# Patient Record
Sex: Female | Born: 1965 | Race: White | Hispanic: No | Marital: Married | Smoking: Never smoker
Health system: Southern US, Community
[De-identification: ages and names within clinical notes are randomized; demographics above are authoritative.]

## PROBLEM LIST (undated history)

## (undated) DIAGNOSIS — G43909 Migraine, unspecified, not intractable, without status migrainosus: Secondary | ICD-10-CM

## (undated) HISTORY — PX: ELBOW SURGERY: SHX618

## (undated) HISTORY — PX: ABDOMINAL HYSTERECTOMY: SHX81

---

## 2005-08-14 ENCOUNTER — Ambulatory Visit (HOSPITAL_COMMUNITY): Admission: RE | Admit: 2005-08-14 | Discharge: 2005-08-14 | Payer: Self-pay | Admitting: Obstetrics and Gynecology

## 2006-09-09 ENCOUNTER — Ambulatory Visit (HOSPITAL_COMMUNITY): Admission: RE | Admit: 2006-09-09 | Discharge: 2006-09-09 | Payer: Self-pay | Admitting: Obstetrics and Gynecology

## 2007-09-25 ENCOUNTER — Ambulatory Visit (HOSPITAL_COMMUNITY): Admission: RE | Admit: 2007-09-25 | Discharge: 2007-09-25 | Payer: Self-pay | Admitting: Family Medicine

## 2009-01-18 ENCOUNTER — Ambulatory Visit (HOSPITAL_COMMUNITY): Admission: RE | Admit: 2009-01-18 | Discharge: 2009-01-18 | Payer: Self-pay | Admitting: Internal Medicine

## 2009-05-30 ENCOUNTER — Encounter (INDEPENDENT_AMBULATORY_CARE_PROVIDER_SITE_OTHER): Payer: Self-pay | Admitting: Obstetrics and Gynecology

## 2009-05-30 ENCOUNTER — Ambulatory Visit (HOSPITAL_COMMUNITY): Admission: RE | Admit: 2009-05-30 | Discharge: 2009-05-31 | Payer: Self-pay | Admitting: Obstetrics and Gynecology

## 2010-04-10 ENCOUNTER — Ambulatory Visit (HOSPITAL_COMMUNITY): Admission: RE | Admit: 2010-04-10 | Payer: Self-pay | Source: Home / Self Care | Admitting: Family Medicine

## 2010-05-06 ENCOUNTER — Encounter: Payer: Self-pay | Admitting: Family Medicine

## 2010-05-14 ENCOUNTER — Other Ambulatory Visit (HOSPITAL_COMMUNITY): Payer: Self-pay | Admitting: Family Medicine

## 2010-05-14 DIAGNOSIS — Z1239 Encounter for other screening for malignant neoplasm of breast: Secondary | ICD-10-CM

## 2010-05-21 ENCOUNTER — Ambulatory Visit (HOSPITAL_COMMUNITY)
Admission: RE | Admit: 2010-05-21 | Discharge: 2010-05-21 | Disposition: A | Discharge status: Home / Self Care | Payer: Managed Care, Other (non HMO) | Source: Ambulatory Visit | Attending: Family Medicine | Admitting: Family Medicine

## 2010-05-21 DIAGNOSIS — Z1231 Encounter for screening mammogram for malignant neoplasm of breast: Secondary | ICD-10-CM | POA: Insufficient documentation

## 2010-05-21 DIAGNOSIS — Z1239 Encounter for other screening for malignant neoplasm of breast: Secondary | ICD-10-CM

## 2010-07-04 LAB — CBC
HCT: 28.8 % — ABNORMAL LOW (ref 36.0–46.0)
HCT: 39.2 % (ref 36.0–46.0)
MCHC: 33.8 g/dL (ref 30.0–36.0)
MCV: 91.1 fL (ref 78.0–100.0)
MCV: 92.3 fL (ref 78.0–100.0)
Platelets: 238 10*3/uL (ref 150–400)
Platelets: 276 10*3/uL (ref 150–400)
RBC: 4.25 MIL/uL (ref 3.87–5.11)
RDW: 12.6 % (ref 11.5–15.5)
WBC: 12.7 10*3/uL — ABNORMAL HIGH (ref 4.0–10.5)
WBC: 7.2 10*3/uL (ref 4.0–10.5)

## 2011-05-01 ENCOUNTER — Other Ambulatory Visit (HOSPITAL_COMMUNITY): Payer: Self-pay | Admitting: Family Medicine

## 2011-05-01 DIAGNOSIS — Z1231 Encounter for screening mammogram for malignant neoplasm of breast: Secondary | ICD-10-CM

## 2011-05-29 ENCOUNTER — Ambulatory Visit (HOSPITAL_COMMUNITY)
Admission: RE | Admit: 2011-05-29 | Discharge: 2011-05-29 | Disposition: A | Discharge status: Home / Self Care | Payer: BC Managed Care – PPO | Source: Ambulatory Visit | Attending: Family Medicine | Admitting: Family Medicine

## 2011-05-29 DIAGNOSIS — Z1231 Encounter for screening mammogram for malignant neoplasm of breast: Secondary | ICD-10-CM

## 2012-03-03 ENCOUNTER — Other Ambulatory Visit: Payer: Self-pay | Admitting: Physical Medicine and Rehabilitation

## 2012-03-03 DIAGNOSIS — N83201 Unspecified ovarian cyst, right side: Secondary | ICD-10-CM

## 2012-03-09 ENCOUNTER — Other Ambulatory Visit: Payer: Managed Care, Other (non HMO)

## 2012-03-17 ENCOUNTER — Encounter (HOSPITAL_COMMUNITY): Payer: Self-pay | Admitting: Emergency Medicine

## 2012-03-17 ENCOUNTER — Emergency Department (HOSPITAL_COMMUNITY)
Admission: EM | Admit: 2012-03-17 | Discharge: 2012-03-17 | Disposition: A | Discharge status: Home / Self Care | Payer: BC Managed Care – PPO | Attending: Emergency Medicine | Admitting: Emergency Medicine

## 2012-03-17 DIAGNOSIS — Y9301 Activity, walking, marching and hiking: Secondary | ICD-10-CM | POA: Insufficient documentation

## 2012-03-17 DIAGNOSIS — M79606 Pain in leg, unspecified: Secondary | ICD-10-CM

## 2012-03-17 DIAGNOSIS — Z79899 Other long term (current) drug therapy: Secondary | ICD-10-CM | POA: Insufficient documentation

## 2012-03-17 DIAGNOSIS — S86119A Strain of other muscle(s) and tendon(s) of posterior muscle group at lower leg level, unspecified leg, initial encounter: Secondary | ICD-10-CM

## 2012-03-17 DIAGNOSIS — S838X9A Sprain of other specified parts of unspecified knee, initial encounter: Secondary | ICD-10-CM | POA: Insufficient documentation

## 2012-03-17 DIAGNOSIS — Y929 Unspecified place or not applicable: Secondary | ICD-10-CM | POA: Insufficient documentation

## 2012-03-17 DIAGNOSIS — X500XXA Overexertion from strenuous movement or load, initial encounter: Secondary | ICD-10-CM | POA: Insufficient documentation

## 2012-03-17 MED ORDER — HYDROCODONE-ACETAMINOPHEN 5-325 MG PO TABS: 1.0000 | ORAL_TABLET | ORAL | Status: AC | PRN

## 2012-03-17 MED ORDER — HYDROCODONE-ACETAMINOPHEN 5-325 MG PO TABS
1.0000 | ORAL_TABLET | Freq: Once | ORAL | Status: AC
  Administered 2012-03-17: 1 via ORAL
  Filled 2012-03-17: qty 1

## 2012-03-17 NOTE — ED Notes (Signed)
Ortho at bedside.

## 2012-03-17 NOTE — Progress Notes (Signed)
Orthopedic Tech Progress Note Patient Details:  Shelley Daniel [birth year] [id number]  Ortho Devices Type of Ortho Device: Crutches;Post (short) splint Splint Material: Fiberglass Ortho Device/Splint Location: right leg Ortho Device/Splint Interventions: Application   [name] 03/17/2012, 8:47 PM

## 2012-03-17 NOTE — ED Provider Notes (Signed)
History     CSN: [id number]  Arrival date & time 03/17/12  1805   First MD Initiated Contact with Patient 03/17/12 1958      Chief Complaint  Patient presents with  . Leg Pain   HPI  History provided by the patient. Patient is a 46 year old female with no significant PMH who presents with complaints of acute right lower leg pain and injury. Patient states that she was walking down some steps when she suddenly felt a pop and sharp pain in her right calf and lower leg. Since that time she has had pain with any kind of walking or pressure to the foot. She also feels a tightness and swelling in the upper calf area it is tender to palpation. Patient has difficulty with any movements in the foot secondary to pain. Patient has not taken any medications for her symptoms. She denies having an prior injuries to her legs. Since injury patient reports having some coolness to the foot and slight numbness.    History reviewed. No pertinent past medical history.  History reviewed. No pertinent past surgical history.  History reviewed. No pertinent family history.  History  Substance Use Topics  . Smoking status: Never Smoker   . Smokeless tobacco: Not on file  . Alcohol Use: No    OB History    Grav Para Term Preterm Abortions TAB SAB Ect Mult Living                  Review of Systems  Musculoskeletal:       Calf pain.  Skin: Negative for rash.  Neurological: Positive for numbness. Negative for weakness.  All other systems reviewed and are negative.    Allergies  Penicillins  Home Medications   Current Outpatient Rx  Name  Route  Sig  Dispense  Refill  . FLUOXETINE HCL 20 MG PO CAPS   Oral   Take 20 mg by mouth daily.         Marland Kitchen KETOROLAC TROMETHAMINE 10 MG PO TABS   Oral   Take 10 mg by mouth 2 (two) times daily as needed. For migraine         . NARATRIPTAN HCL 2.5 MG PO TABS   Oral   Take 2.5 mg by mouth as needed. Take one (1) tablet at onset of headache; if  returns or does not resolve, may repeat after 4 hours; do not exceed five (5) mg in 24 hours.           BP 139/76  Pulse 72  Temp 98.2 F (36.8 C) (Oral)  Resp 18  SpO2 98%  Physical Exam  Nursing note and vitals reviewed. Constitutional: She is oriented to person, place, and time. She appears well-developed and well-nourished. No distress.  HENT:  Head: Normocephalic.  Cardiovascular: Normal rate and regular rhythm.   Pulmonary/Chest: Effort normal and breath sounds normal.  Musculoskeletal: She exhibits edema and tenderness.       Reduced range of motion of right ankle and foot secondary to pain. There is mild/moderate swelling to the posterior right lower leg around the proximal calf. There is tenderness over this area to palpation. Normal Thompson test. Palpation along the acuities tendon appears intact. Patient has normal dorsal pedal pulses. Patient reports having slight numbness in the toes and foot but can sense light touch. Normal cap refill less than 2 seconds.  Neurological: She is alert and oriented to person, place, and time.  Skin: Skin is warm and  dry. No rash noted. No erythema.  Psychiatric: She has a normal mood and affect. Her behavior is normal.    ED Course  Procedures     1. Gastrocnemius muscle tear   2. Leg pain       MDM  8:05PM patient seen and evaluated. Patient appears in mild discomfort.   Patient placed in short leg splint and provided crutches. Patient does not appear to have signs of complete Achilles tendon rupture. She may have partial tear or possibly just muscle tear. Orthopedic referral provided. Patient instructed on RICE treatment.     Angus Seller, [place] 03/17/12 2059

## 2012-03-17 NOTE — ED Notes (Signed)
Pt c/o pain in right calf after stepping off step and feeling pop; pt sts pain down into foot and she is unable to walk on and having numbness in foot

## 2012-03-20 NOTE — ED Provider Notes (Signed)
Medical screening examination/treatment/procedure(s) were performed by non-physician practitioner and as supervising physician I was immediately available for consultation/collaboration.   Joya Gaskins, MD 03/20/12 [id number]

## 2012-04-02 ENCOUNTER — Ambulatory Visit
Admission: RE | Admit: 2012-04-02 | Discharge: 2012-04-02 | Disposition: A | Discharge status: Home / Self Care | Payer: BC Managed Care – PPO | Source: Ambulatory Visit | Attending: Physical Medicine and Rehabilitation | Admitting: Physical Medicine and Rehabilitation

## 2012-04-02 DIAGNOSIS — N83201 Unspecified ovarian cyst, right side: Secondary | ICD-10-CM

## 2012-04-16 ENCOUNTER — Other Ambulatory Visit (HOSPITAL_COMMUNITY): Payer: Self-pay | Admitting: Specialist

## 2012-04-16 DIAGNOSIS — M79669 Pain in unspecified lower leg: Secondary | ICD-10-CM

## 2012-04-16 DIAGNOSIS — M7989 Other specified soft tissue disorders: Secondary | ICD-10-CM

## 2012-04-17 ENCOUNTER — Ambulatory Visit (HOSPITAL_COMMUNITY)
Admission: RE | Admit: 2012-04-17 | Discharge: 2012-04-17 | Disposition: A | Discharge status: Home / Self Care | Payer: BC Managed Care – PPO | Source: Ambulatory Visit | Attending: Specialist | Admitting: Specialist

## 2012-04-17 ENCOUNTER — Ambulatory Visit (HOSPITAL_COMMUNITY): Payer: BC Managed Care – PPO

## 2012-04-17 DIAGNOSIS — M7989 Other specified soft tissue disorders: Secondary | ICD-10-CM

## 2012-04-17 DIAGNOSIS — M79669 Pain in unspecified lower leg: Secondary | ICD-10-CM

## 2012-04-17 DIAGNOSIS — M79609 Pain in unspecified limb: Secondary | ICD-10-CM

## 2012-04-17 NOTE — Progress Notes (Signed)
Right:  No evidence of DVT, superficial thrombosis, or Baker's cyst.  Left:  Negative for DVT in the common femoral vein.  

## 2012-04-27 ENCOUNTER — Other Ambulatory Visit (HOSPITAL_COMMUNITY): Payer: Self-pay | Admitting: Family Medicine

## 2012-04-27 DIAGNOSIS — Z1231 Encounter for screening mammogram for malignant neoplasm of breast: Secondary | ICD-10-CM

## 2012-05-29 ENCOUNTER — Ambulatory Visit (HOSPITAL_COMMUNITY): Payer: BC Managed Care – PPO

## 2012-06-10 ENCOUNTER — Ambulatory Visit (HOSPITAL_COMMUNITY)
Admission: RE | Admit: 2012-06-10 | Discharge: 2012-06-10 | Disposition: A | Discharge status: Home / Self Care | Payer: BC Managed Care – PPO | Source: Ambulatory Visit | Attending: Family Medicine | Admitting: Family Medicine

## 2012-06-10 DIAGNOSIS — Z1231 Encounter for screening mammogram for malignant neoplasm of breast: Secondary | ICD-10-CM

## 2013-01-30 IMAGING — US US PELVIS COMPLETE
1 series · 14 of 25 positions shown · non-contrast
Comparison: None

CLINICAL DATA: Right ovarian cyst seen on MRI.  Post hysterectomy



[Series 1: us pelvis complete · 0.33mm/px · 14 of 47 slices shown]
[im 1/47]
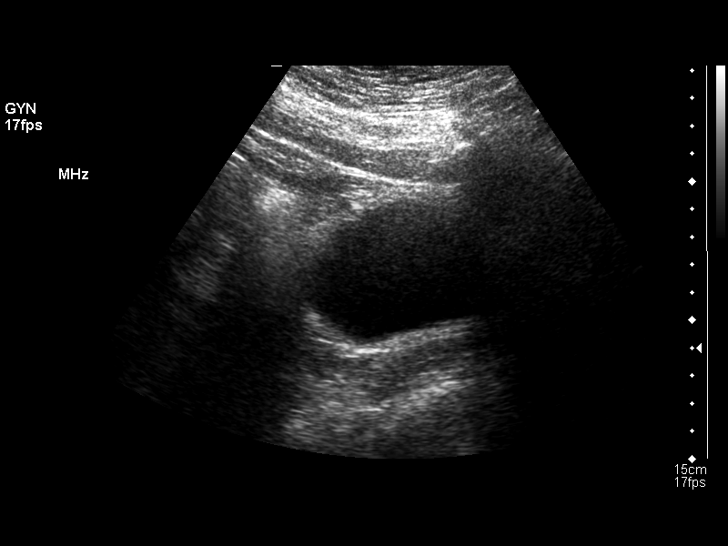
[im 4/47]
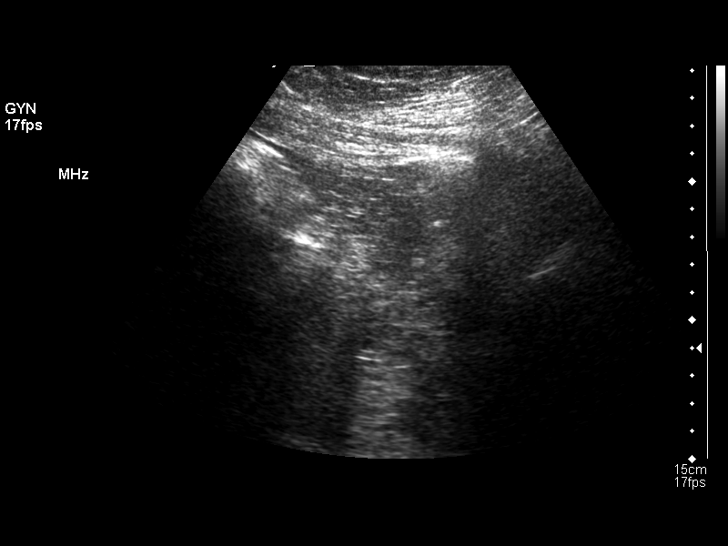
[im 8/47]
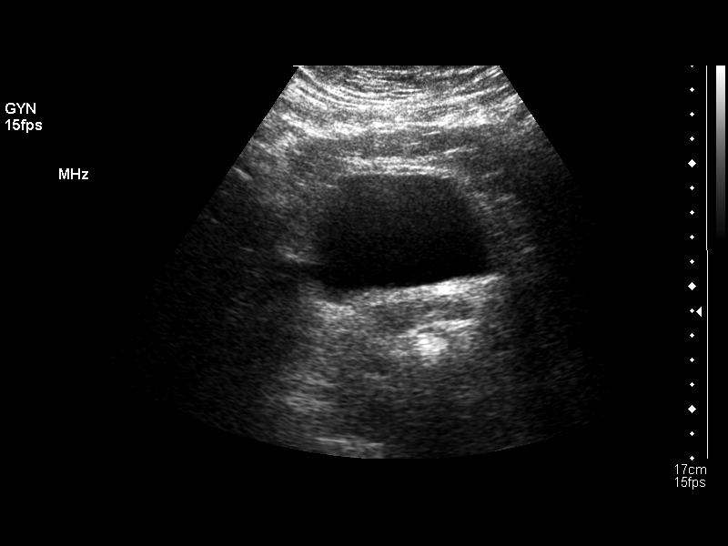
[im 12/47]
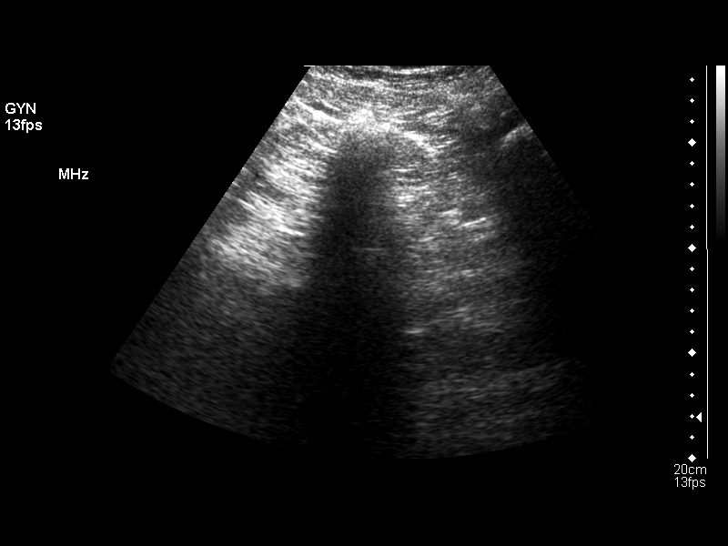
[im 16/47]
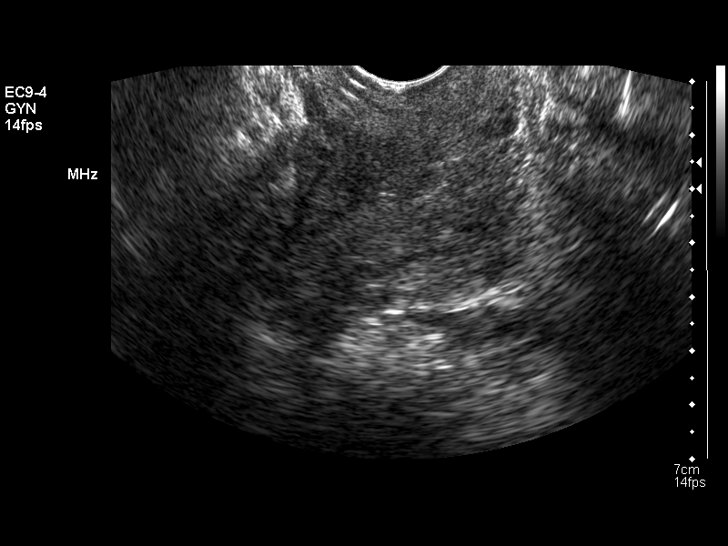
[im 18/47]
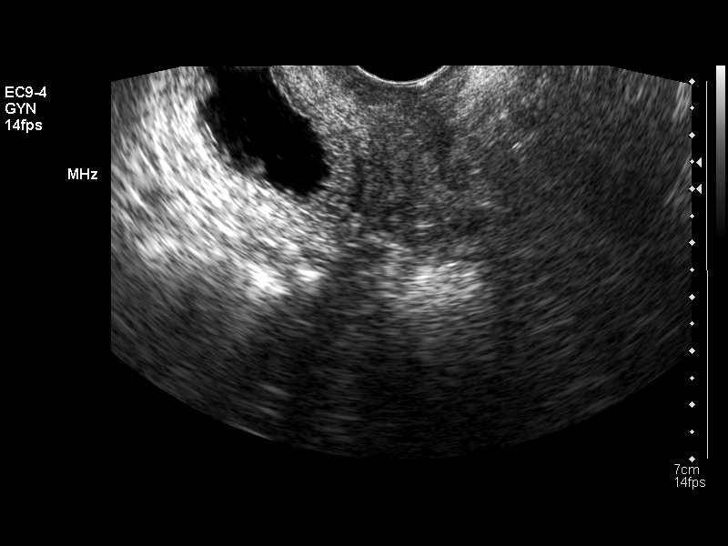
[im 22/47]
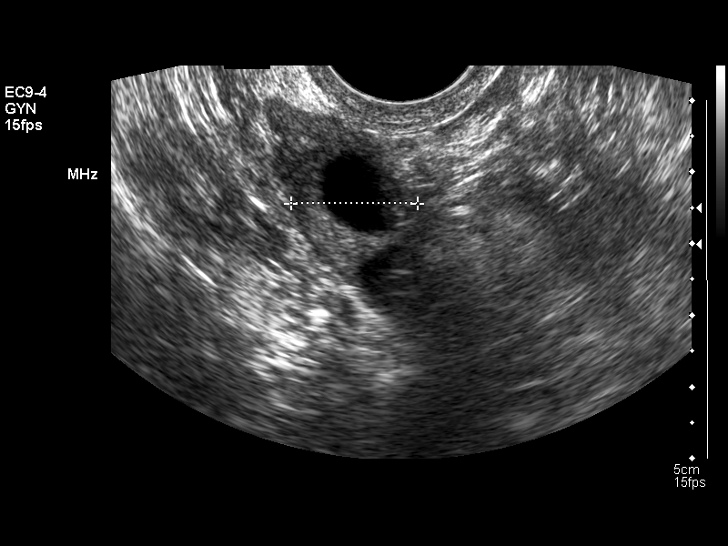
[im 25/47]
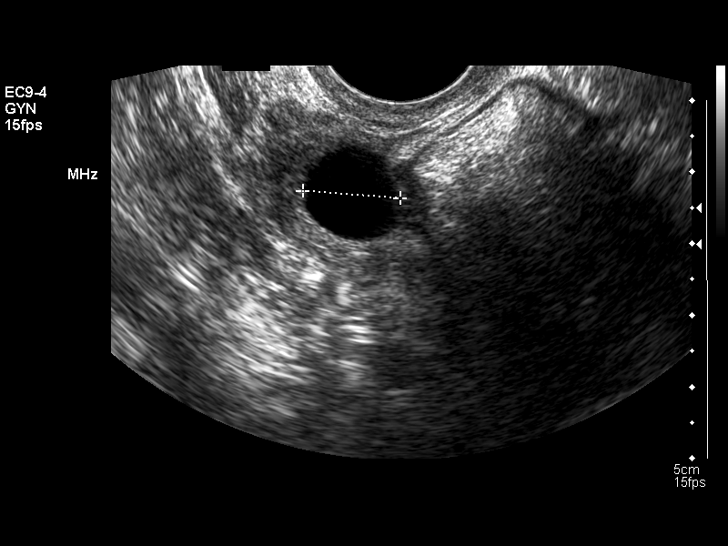
[im 29/47]
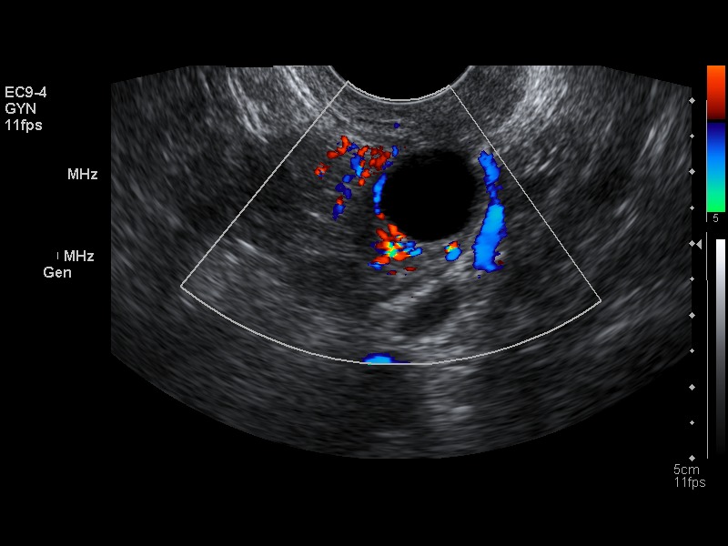
[im 31/47]
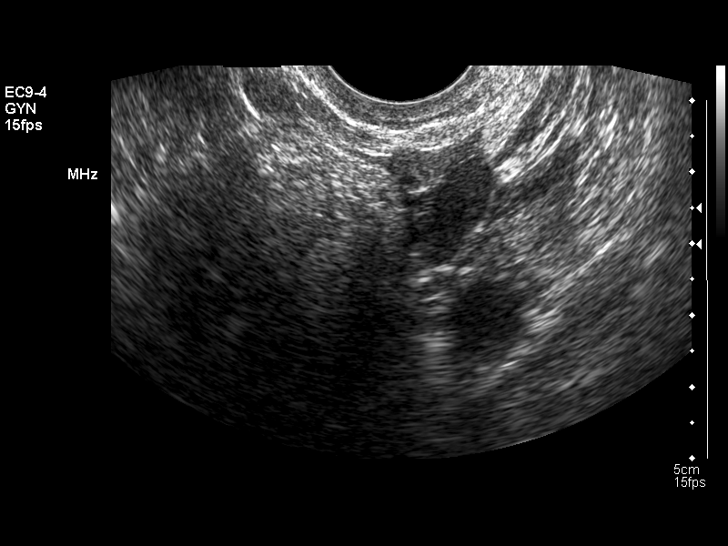
[im 35/47]
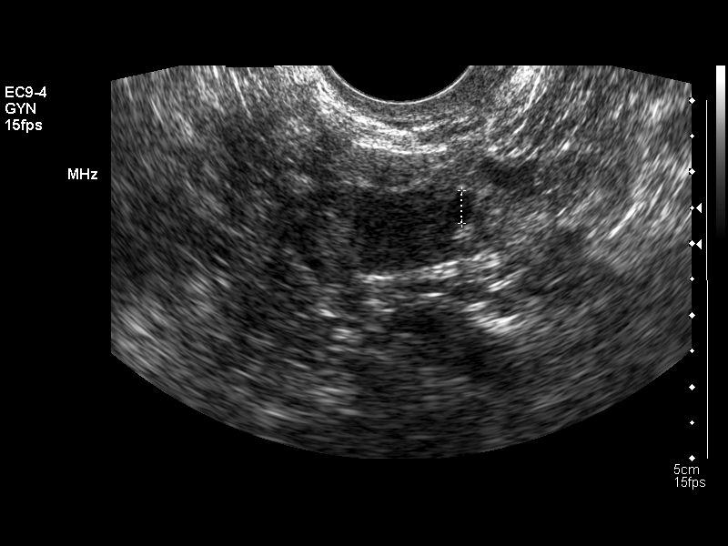
[im 39/47]
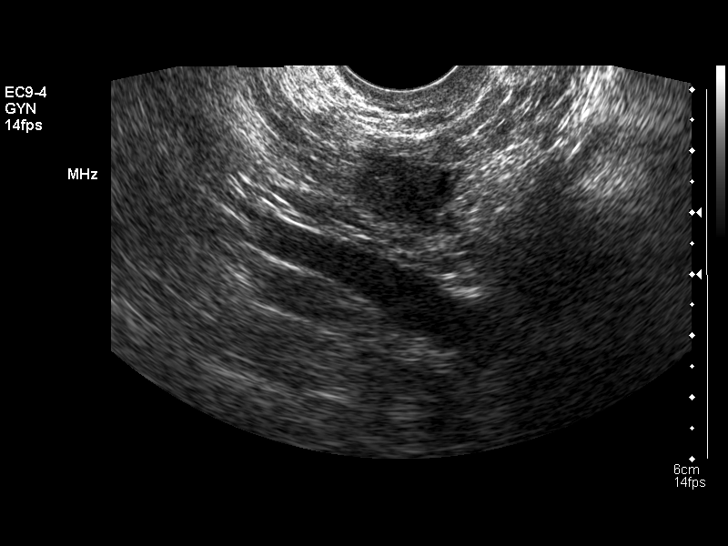
[im 43/47]
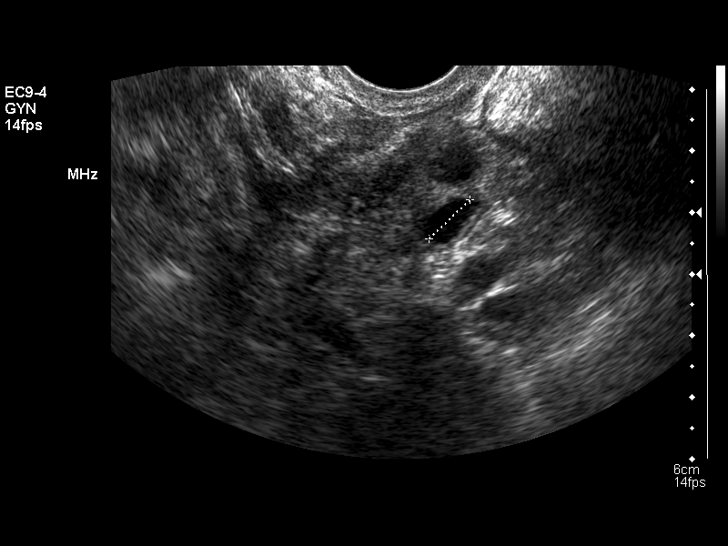
[im 47/47]
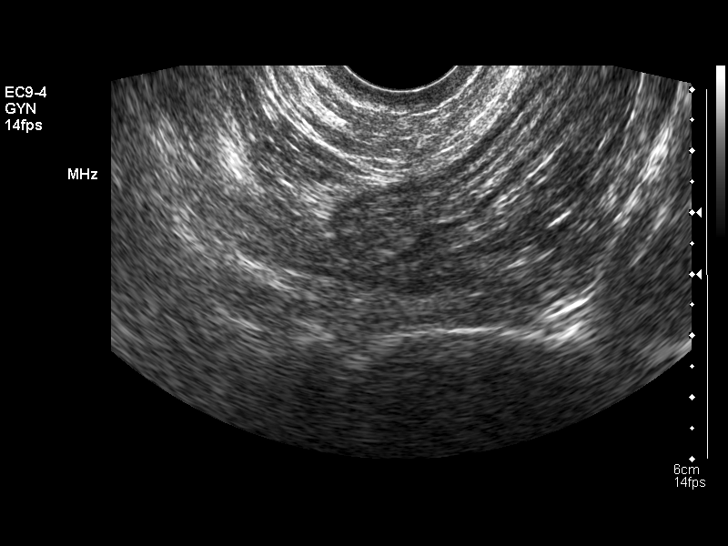

[14 of 25 positions shown; findings below may reference images not displayed]

FINDINGS: Uterus: Has been surgically removed.  A normal vaginal cuff is
identified

Endometrium: Not applicable

Right ovary:  Has a normal appearance measuring 2.5 x 1.6 x 1.8 cm
and contains a 1.4 cm follicle

Left ovary: Has a normal appearance measuring 2.2 x 1.1 x 0.9 cm
and contains several sub centimeter follicles

Other findings: No pelvic fluid or separate adnexal masses are
identified.
IMPRESSION: Normal vaginal cuff and premenopausal appearance to the ovaries..

## 2013-03-05 ENCOUNTER — Encounter (HOSPITAL_BASED_OUTPATIENT_CLINIC_OR_DEPARTMENT_OTHER): Payer: Self-pay | Admitting: Emergency Medicine

## 2013-03-05 ENCOUNTER — Emergency Department (HOSPITAL_BASED_OUTPATIENT_CLINIC_OR_DEPARTMENT_OTHER)
Admission: EM | Admit: 2013-03-05 | Discharge: 2013-03-05 | Disposition: A | Discharge status: Home / Self Care | Payer: BC Managed Care – PPO | Attending: Emergency Medicine | Admitting: Emergency Medicine

## 2013-03-05 DIAGNOSIS — S335XXA Sprain of ligaments of lumbar spine, initial encounter: Secondary | ICD-10-CM | POA: Insufficient documentation

## 2013-03-05 DIAGNOSIS — S39012A Strain of muscle, fascia and tendon of lower back, initial encounter: Secondary | ICD-10-CM

## 2013-03-05 DIAGNOSIS — G43909 Migraine, unspecified, not intractable, without status migrainosus: Secondary | ICD-10-CM | POA: Insufficient documentation

## 2013-03-05 DIAGNOSIS — Y92410 Unspecified street and highway as the place of occurrence of the external cause: Secondary | ICD-10-CM | POA: Insufficient documentation

## 2013-03-05 DIAGNOSIS — S0993XA Unspecified injury of face, initial encounter: Secondary | ICD-10-CM | POA: Insufficient documentation

## 2013-03-05 DIAGNOSIS — Y9389 Activity, other specified: Secondary | ICD-10-CM | POA: Insufficient documentation

## 2013-03-05 DIAGNOSIS — Z880 Allergy status to penicillin: Secondary | ICD-10-CM | POA: Insufficient documentation

## 2013-03-05 DIAGNOSIS — Z79899 Other long term (current) drug therapy: Secondary | ICD-10-CM | POA: Insufficient documentation

## 2013-03-05 HISTORY — DX: Migraine, unspecified, not intractable, without status migrainosus: G43.909

## 2013-03-05 LAB — GLUCOSE, CAPILLARY: Glucose-Capillary: 72 mg/dL (ref 70–99)

## 2013-03-05 MED ORDER — METHOCARBAMOL 500 MG PO TABS
500.0000 mg | ORAL_TABLET | Freq: Two times a day (BID) | ORAL | Status: AC
Start: 2013-03-05 — End: ?

## 2013-03-05 NOTE — ED Notes (Signed)
Crackers and juice offered
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
## 2013-03-05 NOTE — ED Notes (Signed)
MD at bedside. 
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
## 2013-05-24 ENCOUNTER — Other Ambulatory Visit (HOSPITAL_COMMUNITY): Payer: Self-pay | Admitting: Family Medicine

## 2013-05-24 DIAGNOSIS — Z1231 Encounter for screening mammogram for malignant neoplasm of breast: Secondary | ICD-10-CM

## 2013-06-11 ENCOUNTER — Ambulatory Visit (HOSPITAL_COMMUNITY): Payer: BC Managed Care – PPO

## 2013-06-17 ENCOUNTER — Ambulatory Visit (HOSPITAL_COMMUNITY): Payer: BC Managed Care – PPO

## 2013-07-02 ENCOUNTER — Ambulatory Visit (HOSPITAL_COMMUNITY): Payer: BC Managed Care – PPO

## 2013-07-22 ENCOUNTER — Ambulatory Visit (HOSPITAL_COMMUNITY)
Admission: RE | Admit: 2013-07-22 | Discharge: 2013-07-22 | Disposition: A | Discharge status: Home / Self Care | Payer: BC Managed Care – PPO | Source: Ambulatory Visit | Attending: Family Medicine | Admitting: Family Medicine

## 2013-07-22 DIAGNOSIS — Z1231 Encounter for screening mammogram for malignant neoplasm of breast: Secondary | ICD-10-CM | POA: Insufficient documentation

## 2014-10-07 ENCOUNTER — Other Ambulatory Visit (HOSPITAL_COMMUNITY): Payer: Self-pay | Admitting: Family Medicine

## 2014-10-07 DIAGNOSIS — Z1231 Encounter for screening mammogram for malignant neoplasm of breast: Secondary | ICD-10-CM

## 2014-10-08 ENCOUNTER — Encounter (HOSPITAL_BASED_OUTPATIENT_CLINIC_OR_DEPARTMENT_OTHER): Payer: Self-pay | Admitting: Emergency Medicine

## 2014-10-08 ENCOUNTER — Emergency Department (HOSPITAL_BASED_OUTPATIENT_CLINIC_OR_DEPARTMENT_OTHER)
Admission: EM | Admit: 2014-10-08 | Discharge: 2014-10-08 | Disposition: A | Discharge status: Home / Self Care | Payer: No Typology Code available for payment source | Attending: Emergency Medicine | Admitting: Emergency Medicine

## 2014-10-08 DIAGNOSIS — R112 Nausea with vomiting, unspecified: Secondary | ICD-10-CM

## 2014-10-08 DIAGNOSIS — R197 Diarrhea, unspecified: Secondary | ICD-10-CM

## 2014-10-08 DIAGNOSIS — Z79899 Other long term (current) drug therapy: Secondary | ICD-10-CM | POA: Diagnosis not present

## 2014-10-08 DIAGNOSIS — K921 Melena: Secondary | ICD-10-CM | POA: Insufficient documentation

## 2014-10-08 DIAGNOSIS — K625 Hemorrhage of anus and rectum: Secondary | ICD-10-CM | POA: Diagnosis present

## 2014-10-08 DIAGNOSIS — Z880 Allergy status to penicillin: Secondary | ICD-10-CM | POA: Diagnosis not present

## 2014-10-08 DIAGNOSIS — G43909 Migraine, unspecified, not intractable, without status migrainosus: Secondary | ICD-10-CM | POA: Diagnosis not present

## 2014-10-08 LAB — CBC WITH DIFFERENTIAL/PLATELET
Basophils Absolute: 0 10*3/uL (ref 0.0–0.1)
Basophils Relative: 0 % (ref 0–1)
Eosinophils Absolute: 0 10*3/uL (ref 0.0–0.7)
Eosinophils Relative: 0 % (ref 0–5)
HEMATOCRIT: 41.4 % (ref 36.0–46.0)
HEMOGLOBIN: 14.3 g/dL (ref 12.0–15.0)
LYMPHS PCT: 11 % — AB (ref 12–46)
Lymphs Abs: 1.3 10*3/uL (ref 0.7–4.0)
MCH: 30.8 pg (ref 26.0–34.0)
MCHC: 34.5 g/dL (ref 30.0–36.0)
MCV: 89 fL (ref 78.0–100.0)
MONO ABS: 0.9 10*3/uL (ref 0.1–1.0)
MONOS PCT: 7 % (ref 3–12)
NEUTROS ABS: 10.2 10*3/uL — AB (ref 1.7–7.7)
NEUTROS PCT: 82 % — AB (ref 43–77)
Platelets: 278 10*3/uL (ref 150–400)
RBC: 4.65 MIL/uL (ref 3.87–5.11)
RDW: 12.7 % (ref 11.5–15.5)
WBC: 12.4 10*3/uL — AB (ref 4.0–10.5)

## 2014-10-08 LAB — COMPREHENSIVE METABOLIC PANEL
ALK PHOS: 70 U/L (ref 38–126)
ALT: 17 U/L (ref 14–54)
AST: 21 U/L (ref 15–41)
Albumin: 4.2 g/dL (ref 3.5–5.0)
Anion gap: 7 (ref 5–15)
BUN: 18 mg/dL (ref 6–20)
CO2: 27 mmol/L (ref 22–32)
Calcium: 9.5 mg/dL (ref 8.9–10.3)
Chloride: 104 mmol/L (ref 101–111)
Creatinine, Ser: 0.76 mg/dL (ref 0.44–1.00)
GFR calc non Af Amer: >60 mL/min (ref >60)
Glucose, Bld: 112 mg/dL — ABNORMAL HIGH (ref 65–99)
Potassium: 3.7 mmol/L (ref 3.5–5.1)
Sodium: 138 mmol/L (ref 135–145)
Total Bilirubin: 0.6 mg/dL (ref 0.3–1.2)
Total Protein: 7.5 g/dL (ref 6.5–8.1)

## 2014-10-08 LAB — OCCULT BLOOD X 1 CARD TO LAB, STOOL: Fecal Occult Bld: POSITIVE — AB

## 2014-10-08 MED ORDER — ONDANSETRON 4 MG PO TBDP
4.0000 mg | ORAL_TABLET | Freq: Three times a day (TID) | ORAL | Status: AC | PRN
Start: 2014-10-08 — End: ?

## 2014-10-08 MED ORDER — ONDANSETRON HCL 4 MG/2ML IJ SOLN
4.0000 mg | Freq: Once | INTRAMUSCULAR | Status: AC
  Administered 2014-10-08: 4 mg via INTRAVENOUS
  Filled 2014-10-08: qty 2

## 2014-10-08 MED ORDER — DICYCLOMINE HCL 10 MG/ML IM SOLN
20.0000 mg | Freq: Once | INTRAMUSCULAR | Status: AC
  Administered 2014-10-08: 20 mg via INTRAMUSCULAR
  Filled 2014-10-08: qty 2

## 2014-10-08 MED ORDER — SODIUM CHLORIDE 0.9 % IV BOLUS (SEPSIS)
1000.0000 mL | Freq: Once | INTRAVENOUS | Status: AC
  Administered 2014-10-08: 1000 mL via INTRAVENOUS

## 2014-10-08 MED ORDER — PROMETHAZINE HCL 25 MG/ML IJ SOLN
25.0000 mg | Freq: Once | INTRAMUSCULAR | Status: AC
  Administered 2014-10-08: 25 mg via INTRAVENOUS
  Filled 2014-10-08: qty 1

## 2014-10-08 MED ORDER — DICYCLOMINE HCL 20 MG PO TABS: 20.0000 mg | ORAL_TABLET | Freq: Two times a day (BID) | ORAL | Status: AC

## 2014-10-08 NOTE — ED Provider Notes (Signed)
CSN: [id number]     Arrival date & time 10/08/14  1117 History   First MD Initiated Contact with Patient 10/08/14 1201     Chief Complaint  Patient presents with  . Rectal Bleeding     (Consider location/radiation/quality/duration/timing/severity/associated sxs/prior Treatment) HPI Comments: Patient presents today with complaints of nausea, vomiting, and diarrhea.  Onset of symptoms this morning.  She reports that she ate pizza last evening and began feeling nauseous after eating the pizza.  Diarrhea and vomiting began several hours later.  She reports numerous episodes of diarrhea and two episodes of vomiting.   She reports associated diffuse abdominal cramping.  She has taken Zofran and Tums for her symptoms with some relief.  No known sick contacts.  No recent foreign travel.  No recent hospitalizations or antibiotic use.  No fevers, chills, vaginal discharge, vaginal bleeding, melena, or urinary symptoms.  No prior abdominal surgeries aside from a Abdominal Hysterectomy.    Patient is a 49 y.o. female presenting with hematochezia. The history is provided by the patient.  Rectal Bleeding   Past Medical History  Diagnosis Date  . Migraines    Past Surgical History  Procedure Laterality Date  . Elbow surgery    . Abdominal hysterectomy     No family history on file. History  Substance Use Topics  . Smoking status: Never Smoker   . Smokeless tobacco: Not on file  . Alcohol Use: No   OB History    No data available     Review of Systems  Gastrointestinal: Positive for hematochezia.  All other systems reviewed and are negative.     Allergies  Penicillins  Home Medications   Prior to Admission medications   Medication Sig Start Date End Date Taking? Authorizing Provider  SUMAtriptan (IMITREX) 100 MG tablet Take 100 mg by mouth every 2 (two) hours as needed for migraine or headache. May repeat in 2 hours if headache persists or recurs.   Yes Historical Provider, MD   FLUoxetine (PROZAC) 20 MG capsule Take 20 mg by mouth daily.    Historical Provider, MD  HYDROcodone-acetaminophen (NORCO) 5-325 MG per tablet Take 1-2 tablets by mouth every 4 (four) hours as needed for pain. 03/17/12   Ivonne Andrew, PA-C  ketorolac (TORADOL) 10 MG tablet Take 10 mg by mouth 2 (two) times daily as needed. For migraine    Historical Provider, MD  methocarbamol (ROBAXIN) 500 MG tablet Take 1 tablet (500 mg total) by mouth 2 (two) times daily. 03/05/13   Rolland Porter, MD  naratriptan (AMERGE) 2.5 MG tablet Take 2.5 mg by mouth as needed. Take one (1) tablet at onset of headache; if returns or does not resolve, may repeat after 4 hours; do not exceed five (5) mg in 24 hours.    Historical Provider, MD   BP 150/69 mmHg  Pulse 66  Temp(Src) 98.2 F (36.8 C) (Oral)  Resp 18  Ht  (1.676 m)  Wt 180 lb (81.647 kg)  BMI 29.07 kg/m2  SpO2 100% Physical Exam  Constitutional: She appears well-developed and well-nourished.  HENT:  Head: Normocephalic and atraumatic.  Mouth/Throat: Oropharynx is clear and moist.  Neck: Normal range of motion. Neck supple.  Cardiovascular: Normal rate, regular rhythm and normal heart sounds.   Pulmonary/Chest: Effort normal and breath sounds normal.  Abdominal: Soft. Bowel sounds are normal. She exhibits no distension and no mass. There is no tenderness. [id number] no rebound and no guarding.  Genitourinary: Rectum normal. Rectal exam  shows no external hemorrhoid.  No gross blood  Neurological: She is alert.  Skin: Skin is warm and dry.  Psychiatric: She has a normal mood and affect.  Nursing note and vitals reviewed.   ED Course  Procedures (including critical care time) Labs Review Labs Reviewed  CBC WITH DIFFERENTIAL/PLATELET - Abnormal; Notable for the following:    WBC 12.4 (*)    Neutrophils Relative % 82 (*)    Neutro Abs 10.2 (*)    Lymphocytes Relative 11 (*)    All other components within normal limits  COMPREHENSIVE METABOLIC  PANEL  OCCULT BLOOD X 1 CARD TO LAB, STOOL    Imaging Review No results found.   EKG Interpretation None     3:00 PM Reassessed patient.  She reports that her symptoms have improved.  Tolerating PO liquids.  Abdomen is soft and non tender.   MDM   Final diagnoses:  None   Patient presents today with a complaints of nausea, vomiting, diarrhea, and hematochezia.  Onset of symptoms this morning.  Labs today unremarkable.  Abdomen is soft and non tender.  No gross blood with rectal exam at this time.  Symptoms improved while in the ED and patient able to tolerate PO liquids.  Suspect Viral Gastroenteritis.  Patient stable for discharge.  Return precautions given.      Santiago Glad, PA-C 10/10/14 0926  Santiago Glad, PA-C 10/10/14 8115  Arby Barrette, MD 10/10/14 [id number]

## 2014-10-08 NOTE — ED Notes (Signed)
Bright red blood from rectum, onset 0200hrs, states has had N/V/D (states blood amount is small amt), also having abd cramps. Appetite good since onset of episode

## 2014-10-08 NOTE — Discharge Instructions (Signed)
Follow up with your primary care doctor about your hospital visit. Continue to hydrate orally.Take all medications as prescribed & use Zofran as directed for nausea & vomiting.  Read the instructions below for reasons to return to the ER.  ° °The 'BRAT' diet is suggested, then progress to diet as tolerated as symptoms abate. Call if bloody stools, persistent diarrhea, vomiting, fever or abdominal pain. °Bananas.  °Rice.  °Applesauce.  °Toast (and other simple starches such as crackers, potatoes, noodles).  ° °SEEK IMMEDIATE MEDICAL ATTENTION IF: ° °You begin having localized abdominal pain that does not go away or becomes severe (The right side could  possibly be appendicitis. In an adult, the left lower portion of the abdomen could be colitis or diverticulitis) °  °A temperature above 101 develops ° °Repeated vomiting occurs (multiple uncontrollable episodes) or you are unable to keep fluids down ° °Blood is being passed in stools or vomit (bright red or black tarry stools).  ° °Return also if you develop chest pain, difficulty breathing, dizziness or fainting, or become confused, poorly responsive, or inconsolable (young children). ° ° ° ° ° ° ° °

## 2014-10-08 NOTE — ED Notes (Signed)
Pt tolerating PO fluids well, states has been voiding, but no further diarrhea since ED visit

## 2014-10-08 NOTE — ED Notes (Signed)
Pt states woke up last around 100am with nausea and vomiting and then started havnig diarrhea.  Pt states bright red diarrhea 30+ times since 100am.

## 2014-10-18 ENCOUNTER — Ambulatory Visit (HOSPITAL_COMMUNITY)
Admission: RE | Admit: 2014-10-18 | Discharge: 2014-10-18 | Disposition: A | Discharge status: Home / Self Care | Payer: No Typology Code available for payment source | Source: Ambulatory Visit | Attending: Family Medicine | Admitting: Family Medicine

## 2014-10-18 DIAGNOSIS — Z1231 Encounter for screening mammogram for malignant neoplasm of breast: Secondary | ICD-10-CM | POA: Diagnosis present

## 2014-12-06 ENCOUNTER — Other Ambulatory Visit: Payer: Self-pay | Admitting: Obstetrics and Gynecology

## 2014-12-06 DIAGNOSIS — N6310 Unspecified lump in the right breast, unspecified quadrant: Secondary | ICD-10-CM

## 2014-12-09 ENCOUNTER — Ambulatory Visit
Admission: RE | Admit: 2014-12-09 | Discharge: 2014-12-09 | Disposition: A | Discharge status: Home / Self Care | Payer: No Typology Code available for payment source | Source: Ambulatory Visit | Attending: Obstetrics and Gynecology | Admitting: Obstetrics and Gynecology

## 2014-12-09 DIAGNOSIS — N6310 Unspecified lump in the right breast, unspecified quadrant: Secondary | ICD-10-CM

## 2015-11-16 ENCOUNTER — Other Ambulatory Visit: Payer: Self-pay | Admitting: Family Medicine

## 2015-11-16 DIAGNOSIS — Z1231 Encounter for screening mammogram for malignant neoplasm of breast: Secondary | ICD-10-CM

## 2015-12-05 ENCOUNTER — Ambulatory Visit
Admission: RE | Admit: 2015-12-05 | Discharge: 2015-12-05 | Disposition: A | Discharge status: Home / Self Care | Payer: Managed Care, Other (non HMO) | Source: Ambulatory Visit | Attending: Family Medicine | Admitting: Family Medicine

## 2015-12-05 ENCOUNTER — Other Ambulatory Visit: Payer: Self-pay | Admitting: Obstetrics and Gynecology

## 2015-12-05 DIAGNOSIS — N951 Menopausal and female climacteric states: Secondary | ICD-10-CM

## 2015-12-05 DIAGNOSIS — Z1231 Encounter for screening mammogram for malignant neoplasm of breast: Secondary | ICD-10-CM

## 2015-12-19 ENCOUNTER — Ambulatory Visit
Admission: RE | Admit: 2015-12-19 | Discharge: 2015-12-19 | Disposition: A | Discharge status: Home / Self Care | Payer: Managed Care, Other (non HMO) | Source: Ambulatory Visit | Attending: Obstetrics and Gynecology | Admitting: Obstetrics and Gynecology

## 2015-12-19 DIAGNOSIS — N951 Menopausal and female climacteric states: Secondary | ICD-10-CM

## 2021-04-19 ENCOUNTER — Other Ambulatory Visit: Payer: Self-pay | Admitting: Family Medicine

## 2021-04-19 DIAGNOSIS — Z1231 Encounter for screening mammogram for malignant neoplasm of breast: Secondary | ICD-10-CM

## 2021-06-01 ENCOUNTER — Ambulatory Visit
Admission: RE | Admit: 2021-06-01 | Discharge: 2021-06-01 | Disposition: A | Discharge status: Home / Self Care | Payer: No Typology Code available for payment source | Source: Ambulatory Visit | Attending: Family Medicine | Admitting: Family Medicine

## 2021-06-01 DIAGNOSIS — Z1231 Encounter for screening mammogram for malignant neoplasm of breast: Secondary | ICD-10-CM
